# Patient Record
Sex: Male | Born: 1935 | Race: White | Hispanic: No | Smoking: Former smoker
Health system: Southern US, Community
[De-identification: ages and names within clinical notes are randomized; demographics above are authoritative.]

## PROBLEM LIST (undated history)

## (undated) DIAGNOSIS — E119 Type 2 diabetes mellitus without complications: Secondary | ICD-10-CM

## (undated) DIAGNOSIS — J449 Chronic obstructive pulmonary disease, unspecified: Secondary | ICD-10-CM

---

## 2015-12-16 DIAGNOSIS — G4733 Obstructive sleep apnea (adult) (pediatric): Secondary | ICD-10-CM | POA: Diagnosis not present

## 2015-12-18 DIAGNOSIS — E038 Other specified hypothyroidism: Secondary | ICD-10-CM | POA: Diagnosis not present

## 2015-12-18 DIAGNOSIS — E559 Vitamin D deficiency, unspecified: Secondary | ICD-10-CM | POA: Diagnosis not present

## 2015-12-18 DIAGNOSIS — M25551 Pain in right hip: Secondary | ICD-10-CM | POA: Diagnosis not present

## 2015-12-18 DIAGNOSIS — R739 Hyperglycemia, unspecified: Secondary | ICD-10-CM | POA: Diagnosis not present

## 2015-12-18 DIAGNOSIS — F988 Other specified behavioral and emotional disorders with onset usually occurring in childhood and adolescence: Secondary | ICD-10-CM | POA: Diagnosis not present

## 2015-12-18 DIAGNOSIS — K219 Gastro-esophageal reflux disease without esophagitis: Secondary | ICD-10-CM | POA: Diagnosis not present

## 2015-12-18 DIAGNOSIS — C9210 Chronic myeloid leukemia, BCR/ABL-positive, not having achieved remission: Secondary | ICD-10-CM | POA: Diagnosis not present

## 2015-12-18 DIAGNOSIS — B349 Viral infection, unspecified: Secondary | ICD-10-CM | POA: Diagnosis not present

## 2015-12-19 DIAGNOSIS — J449 Chronic obstructive pulmonary disease, unspecified: Secondary | ICD-10-CM | POA: Diagnosis not present

## 2016-01-11 DIAGNOSIS — R0902 Hypoxemia: Secondary | ICD-10-CM | POA: Diagnosis not present

## 2016-01-11 DIAGNOSIS — G911 Obstructive hydrocephalus: Secondary | ICD-10-CM | POA: Diagnosis not present

## 2016-01-11 DIAGNOSIS — J449 Chronic obstructive pulmonary disease, unspecified: Secondary | ICD-10-CM | POA: Diagnosis not present

## 2016-01-12 DIAGNOSIS — R0902 Hypoxemia: Secondary | ICD-10-CM | POA: Diagnosis not present

## 2016-01-12 DIAGNOSIS — J449 Chronic obstructive pulmonary disease, unspecified: Secondary | ICD-10-CM | POA: Diagnosis not present

## 2016-01-12 DIAGNOSIS — G911 Obstructive hydrocephalus: Secondary | ICD-10-CM | POA: Diagnosis not present

## 2016-01-14 DIAGNOSIS — J690 Pneumonitis due to inhalation of food and vomit: Secondary | ICD-10-CM | POA: Diagnosis not present

## 2016-01-16 DIAGNOSIS — G4733 Obstructive sleep apnea (adult) (pediatric): Secondary | ICD-10-CM | POA: Diagnosis not present

## 2016-01-19 DIAGNOSIS — J449 Chronic obstructive pulmonary disease, unspecified: Secondary | ICD-10-CM | POA: Diagnosis not present

## 2016-02-12 DIAGNOSIS — J690 Pneumonitis due to inhalation of food and vomit: Secondary | ICD-10-CM | POA: Diagnosis not present

## 2016-02-12 DIAGNOSIS — J95821 Acute postprocedural respiratory failure: Secondary | ICD-10-CM | POA: Diagnosis not present

## 2016-02-13 DIAGNOSIS — G4733 Obstructive sleep apnea (adult) (pediatric): Secondary | ICD-10-CM | POA: Diagnosis not present

## 2016-02-16 DIAGNOSIS — J449 Chronic obstructive pulmonary disease, unspecified: Secondary | ICD-10-CM | POA: Diagnosis not present

## 2016-03-11 DIAGNOSIS — J690 Pneumonitis due to inhalation of food and vomit: Secondary | ICD-10-CM | POA: Diagnosis not present

## 2016-03-13 DIAGNOSIS — J95821 Acute postprocedural respiratory failure: Secondary | ICD-10-CM | POA: Diagnosis not present

## 2016-03-15 DIAGNOSIS — G4733 Obstructive sleep apnea (adult) (pediatric): Secondary | ICD-10-CM | POA: Diagnosis not present

## 2016-03-17 DIAGNOSIS — Z0000 Encounter for general adult medical examination without abnormal findings: Secondary | ICD-10-CM | POA: Diagnosis not present

## 2016-03-17 DIAGNOSIS — R739 Hyperglycemia, unspecified: Secondary | ICD-10-CM | POA: Diagnosis not present

## 2016-03-17 DIAGNOSIS — E119 Type 2 diabetes mellitus without complications: Secondary | ICD-10-CM | POA: Diagnosis not present

## 2016-03-17 DIAGNOSIS — Z1389 Encounter for screening for other disorder: Secondary | ICD-10-CM | POA: Diagnosis not present

## 2016-03-17 DIAGNOSIS — N401 Enlarged prostate with lower urinary tract symptoms: Secondary | ICD-10-CM | POA: Diagnosis not present

## 2016-03-17 DIAGNOSIS — Z9181 History of falling: Secondary | ICD-10-CM | POA: Diagnosis not present

## 2016-03-17 DIAGNOSIS — M109 Gout, unspecified: Secondary | ICD-10-CM | POA: Diagnosis not present

## 2016-03-17 DIAGNOSIS — J449 Chronic obstructive pulmonary disease, unspecified: Secondary | ICD-10-CM | POA: Diagnosis not present

## 2016-03-18 DIAGNOSIS — J449 Chronic obstructive pulmonary disease, unspecified: Secondary | ICD-10-CM | POA: Diagnosis not present

## 2016-03-28 DIAGNOSIS — L0390 Cellulitis, unspecified: Secondary | ICD-10-CM | POA: Diagnosis not present

## 2016-03-28 DIAGNOSIS — A932 Colorado tick fever: Secondary | ICD-10-CM | POA: Diagnosis not present

## 2016-04-02 DIAGNOSIS — J690 Pneumonitis due to inhalation of food and vomit: Secondary | ICD-10-CM | POA: Diagnosis not present

## 2016-04-02 DIAGNOSIS — J449 Chronic obstructive pulmonary disease, unspecified: Secondary | ICD-10-CM | POA: Diagnosis not present

## 2016-04-11 DIAGNOSIS — J690 Pneumonitis due to inhalation of food and vomit: Secondary | ICD-10-CM | POA: Diagnosis not present

## 2016-04-13 DIAGNOSIS — J95821 Acute postprocedural respiratory failure: Secondary | ICD-10-CM | POA: Diagnosis not present

## 2016-04-14 DIAGNOSIS — E119 Type 2 diabetes mellitus without complications: Secondary | ICD-10-CM | POA: Diagnosis not present

## 2016-04-14 DIAGNOSIS — G4733 Obstructive sleep apnea (adult) (pediatric): Secondary | ICD-10-CM | POA: Diagnosis not present

## 2016-04-14 DIAGNOSIS — Z9981 Dependence on supplemental oxygen: Secondary | ICD-10-CM | POA: Diagnosis not present

## 2016-04-14 DIAGNOSIS — J449 Chronic obstructive pulmonary disease, unspecified: Secondary | ICD-10-CM | POA: Diagnosis not present

## 2016-04-14 DIAGNOSIS — Z0000 Encounter for general adult medical examination without abnormal findings: Secondary | ICD-10-CM | POA: Diagnosis not present

## 2016-04-14 DIAGNOSIS — Z6825 Body mass index (BMI) 25.0-25.9, adult: Secondary | ICD-10-CM | POA: Diagnosis not present

## 2016-04-17 DIAGNOSIS — J449 Chronic obstructive pulmonary disease, unspecified: Secondary | ICD-10-CM | POA: Diagnosis not present

## 2016-05-11 DIAGNOSIS — J690 Pneumonitis due to inhalation of food and vomit: Secondary | ICD-10-CM | POA: Diagnosis not present

## 2016-05-13 DIAGNOSIS — J690 Pneumonitis due to inhalation of food and vomit: Secondary | ICD-10-CM | POA: Diagnosis not present

## 2016-05-13 DIAGNOSIS — J95821 Acute postprocedural respiratory failure: Secondary | ICD-10-CM | POA: Diagnosis not present

## 2016-05-13 DIAGNOSIS — G4733 Obstructive sleep apnea (adult) (pediatric): Secondary | ICD-10-CM | POA: Diagnosis not present

## 2016-05-13 DIAGNOSIS — G911 Obstructive hydrocephalus: Secondary | ICD-10-CM | POA: Diagnosis not present

## 2016-05-15 DIAGNOSIS — G4733 Obstructive sleep apnea (adult) (pediatric): Secondary | ICD-10-CM | POA: Diagnosis not present

## 2016-05-18 DIAGNOSIS — J449 Chronic obstructive pulmonary disease, unspecified: Secondary | ICD-10-CM | POA: Diagnosis not present

## 2016-06-11 DIAGNOSIS — J690 Pneumonitis due to inhalation of food and vomit: Secondary | ICD-10-CM | POA: Diagnosis not present

## 2016-06-13 DIAGNOSIS — J95821 Acute postprocedural respiratory failure: Secondary | ICD-10-CM | POA: Diagnosis not present

## 2016-06-13 DIAGNOSIS — J690 Pneumonitis due to inhalation of food and vomit: Secondary | ICD-10-CM | POA: Diagnosis not present

## 2016-06-14 DIAGNOSIS — J690 Pneumonitis due to inhalation of food and vomit: Secondary | ICD-10-CM | POA: Diagnosis not present

## 2016-06-17 DIAGNOSIS — J449 Chronic obstructive pulmonary disease, unspecified: Secondary | ICD-10-CM | POA: Diagnosis not present

## 2016-06-25 DIAGNOSIS — N401 Enlarged prostate with lower urinary tract symptoms: Secondary | ICD-10-CM | POA: Diagnosis not present

## 2016-06-25 DIAGNOSIS — J449 Chronic obstructive pulmonary disease, unspecified: Secondary | ICD-10-CM | POA: Diagnosis not present

## 2016-06-25 DIAGNOSIS — M109 Gout, unspecified: Secondary | ICD-10-CM | POA: Diagnosis not present

## 2016-06-25 DIAGNOSIS — R739 Hyperglycemia, unspecified: Secondary | ICD-10-CM | POA: Diagnosis not present

## 2016-07-11 DIAGNOSIS — J690 Pneumonitis due to inhalation of food and vomit: Secondary | ICD-10-CM | POA: Diagnosis not present

## 2016-07-13 DIAGNOSIS — G911 Obstructive hydrocephalus: Secondary | ICD-10-CM | POA: Diagnosis not present

## 2016-07-13 DIAGNOSIS — G4733 Obstructive sleep apnea (adult) (pediatric): Secondary | ICD-10-CM | POA: Diagnosis not present

## 2016-07-13 DIAGNOSIS — J690 Pneumonitis due to inhalation of food and vomit: Secondary | ICD-10-CM | POA: Diagnosis not present

## 2016-07-15 DIAGNOSIS — J690 Pneumonitis due to inhalation of food and vomit: Secondary | ICD-10-CM | POA: Diagnosis not present

## 2016-07-15 DIAGNOSIS — G4733 Obstructive sleep apnea (adult) (pediatric): Secondary | ICD-10-CM | POA: Diagnosis not present

## 2016-07-15 DIAGNOSIS — G911 Obstructive hydrocephalus: Secondary | ICD-10-CM | POA: Diagnosis not present

## 2016-07-18 DIAGNOSIS — J449 Chronic obstructive pulmonary disease, unspecified: Secondary | ICD-10-CM | POA: Diagnosis not present

## 2016-07-21 DIAGNOSIS — E119 Type 2 diabetes mellitus without complications: Secondary | ICD-10-CM | POA: Diagnosis not present

## 2016-08-11 DIAGNOSIS — G4733 Obstructive sleep apnea (adult) (pediatric): Secondary | ICD-10-CM | POA: Diagnosis not present

## 2016-08-13 DIAGNOSIS — G911 Obstructive hydrocephalus: Secondary | ICD-10-CM | POA: Diagnosis not present

## 2016-08-13 DIAGNOSIS — J690 Pneumonitis due to inhalation of food and vomit: Secondary | ICD-10-CM | POA: Diagnosis not present

## 2016-08-13 DIAGNOSIS — G4733 Obstructive sleep apnea (adult) (pediatric): Secondary | ICD-10-CM | POA: Diagnosis not present

## 2016-08-13 DIAGNOSIS — J95821 Acute postprocedural respiratory failure: Secondary | ICD-10-CM | POA: Diagnosis not present

## 2016-08-15 DIAGNOSIS — G4733 Obstructive sleep apnea (adult) (pediatric): Secondary | ICD-10-CM | POA: Diagnosis not present

## 2016-08-18 DIAGNOSIS — J449 Chronic obstructive pulmonary disease, unspecified: Secondary | ICD-10-CM | POA: Diagnosis not present

## 2016-09-11 DIAGNOSIS — J690 Pneumonitis due to inhalation of food and vomit: Secondary | ICD-10-CM | POA: Diagnosis not present

## 2016-09-13 DIAGNOSIS — J95821 Acute postprocedural respiratory failure: Secondary | ICD-10-CM | POA: Diagnosis not present

## 2016-09-13 DIAGNOSIS — J690 Pneumonitis due to inhalation of food and vomit: Secondary | ICD-10-CM | POA: Diagnosis not present

## 2016-09-14 DIAGNOSIS — G4733 Obstructive sleep apnea (adult) (pediatric): Secondary | ICD-10-CM | POA: Diagnosis not present

## 2016-09-17 DIAGNOSIS — J449 Chronic obstructive pulmonary disease, unspecified: Secondary | ICD-10-CM | POA: Diagnosis not present

## 2016-10-01 DIAGNOSIS — N401 Enlarged prostate with lower urinary tract symptoms: Secondary | ICD-10-CM | POA: Diagnosis not present

## 2016-10-01 DIAGNOSIS — R739 Hyperglycemia, unspecified: Secondary | ICD-10-CM | POA: Diagnosis not present

## 2016-10-01 DIAGNOSIS — Z6825 Body mass index (BMI) 25.0-25.9, adult: Secondary | ICD-10-CM | POA: Diagnosis not present

## 2016-10-01 DIAGNOSIS — E663 Overweight: Secondary | ICD-10-CM | POA: Diagnosis not present

## 2016-10-01 DIAGNOSIS — M109 Gout, unspecified: Secondary | ICD-10-CM | POA: Diagnosis not present

## 2016-10-01 DIAGNOSIS — J449 Chronic obstructive pulmonary disease, unspecified: Secondary | ICD-10-CM | POA: Diagnosis not present

## 2016-10-02 DIAGNOSIS — J95821 Acute postprocedural respiratory failure: Secondary | ICD-10-CM | POA: Diagnosis not present

## 2016-10-02 DIAGNOSIS — J449 Chronic obstructive pulmonary disease, unspecified: Secondary | ICD-10-CM | POA: Diagnosis not present

## 2016-10-02 DIAGNOSIS — J690 Pneumonitis due to inhalation of food and vomit: Secondary | ICD-10-CM | POA: Diagnosis not present

## 2016-10-11 DIAGNOSIS — J690 Pneumonitis due to inhalation of food and vomit: Secondary | ICD-10-CM | POA: Diagnosis not present

## 2016-10-13 DIAGNOSIS — J690 Pneumonitis due to inhalation of food and vomit: Secondary | ICD-10-CM | POA: Diagnosis not present

## 2016-10-15 DIAGNOSIS — G4733 Obstructive sleep apnea (adult) (pediatric): Secondary | ICD-10-CM | POA: Diagnosis not present

## 2016-10-18 DIAGNOSIS — J449 Chronic obstructive pulmonary disease, unspecified: Secondary | ICD-10-CM | POA: Diagnosis not present

## 2016-10-22 DIAGNOSIS — J449 Chronic obstructive pulmonary disease, unspecified: Secondary | ICD-10-CM | POA: Diagnosis not present

## 2016-10-22 DIAGNOSIS — J690 Pneumonitis due to inhalation of food and vomit: Secondary | ICD-10-CM | POA: Diagnosis not present

## 2016-11-11 DIAGNOSIS — J690 Pneumonitis due to inhalation of food and vomit: Secondary | ICD-10-CM | POA: Diagnosis not present

## 2016-11-13 DIAGNOSIS — J690 Pneumonitis due to inhalation of food and vomit: Secondary | ICD-10-CM | POA: Diagnosis not present

## 2016-11-13 DIAGNOSIS — J95821 Acute postprocedural respiratory failure: Secondary | ICD-10-CM | POA: Diagnosis not present

## 2016-11-14 DIAGNOSIS — J95821 Acute postprocedural respiratory failure: Secondary | ICD-10-CM | POA: Diagnosis not present

## 2016-12-11 DIAGNOSIS — J690 Pneumonitis due to inhalation of food and vomit: Secondary | ICD-10-CM | POA: Diagnosis not present

## 2016-12-13 DIAGNOSIS — J690 Pneumonitis due to inhalation of food and vomit: Secondary | ICD-10-CM | POA: Diagnosis not present

## 2016-12-15 DIAGNOSIS — G4733 Obstructive sleep apnea (adult) (pediatric): Secondary | ICD-10-CM | POA: Diagnosis not present

## 2017-01-06 DIAGNOSIS — N401 Enlarged prostate with lower urinary tract symptoms: Secondary | ICD-10-CM | POA: Diagnosis not present

## 2017-01-06 DIAGNOSIS — R739 Hyperglycemia, unspecified: Secondary | ICD-10-CM | POA: Diagnosis not present

## 2017-01-06 DIAGNOSIS — M109 Gout, unspecified: Secondary | ICD-10-CM | POA: Diagnosis not present

## 2017-01-06 DIAGNOSIS — Z6825 Body mass index (BMI) 25.0-25.9, adult: Secondary | ICD-10-CM | POA: Diagnosis not present

## 2017-01-06 DIAGNOSIS — E663 Overweight: Secondary | ICD-10-CM | POA: Diagnosis not present

## 2017-01-06 DIAGNOSIS — J449 Chronic obstructive pulmonary disease, unspecified: Secondary | ICD-10-CM | POA: Diagnosis not present

## 2017-01-06 DIAGNOSIS — D649 Anemia, unspecified: Secondary | ICD-10-CM | POA: Diagnosis not present

## 2017-01-06 DIAGNOSIS — Z791 Long term (current) use of non-steroidal anti-inflammatories (NSAID): Secondary | ICD-10-CM | POA: Diagnosis not present

## 2017-01-06 DIAGNOSIS — H6121 Impacted cerumen, right ear: Secondary | ICD-10-CM | POA: Diagnosis not present

## 2017-01-11 DIAGNOSIS — J690 Pneumonitis due to inhalation of food and vomit: Secondary | ICD-10-CM | POA: Diagnosis not present

## 2017-01-13 DIAGNOSIS — G4733 Obstructive sleep apnea (adult) (pediatric): Secondary | ICD-10-CM | POA: Diagnosis not present

## 2017-01-15 DIAGNOSIS — G4733 Obstructive sleep apnea (adult) (pediatric): Secondary | ICD-10-CM | POA: Diagnosis not present

## 2017-02-11 DIAGNOSIS — G4733 Obstructive sleep apnea (adult) (pediatric): Secondary | ICD-10-CM | POA: Diagnosis not present

## 2017-02-12 DIAGNOSIS — G4733 Obstructive sleep apnea (adult) (pediatric): Secondary | ICD-10-CM | POA: Diagnosis not present

## 2017-02-19 ENCOUNTER — Emergency Department (HOSPITAL_COMMUNITY): Payer: PPO

## 2017-02-19 ENCOUNTER — Encounter (HOSPITAL_COMMUNITY): Payer: Self-pay

## 2017-02-19 ENCOUNTER — Emergency Department (HOSPITAL_COMMUNITY)
Admission: EM | Admit: 2017-02-19 | Discharge: 2017-02-19 | Disposition: A | Discharge status: Home / Self Care | Payer: PPO | Attending: Emergency Medicine | Admitting: Emergency Medicine

## 2017-02-19 DIAGNOSIS — Z87891 Personal history of nicotine dependence: Secondary | ICD-10-CM | POA: Diagnosis not present

## 2017-02-19 DIAGNOSIS — S4992XA Unspecified injury of left shoulder and upper arm, initial encounter: Secondary | ICD-10-CM | POA: Diagnosis not present

## 2017-02-19 DIAGNOSIS — Y999 Unspecified external cause status: Secondary | ICD-10-CM | POA: Insufficient documentation

## 2017-02-19 DIAGNOSIS — Y9389 Activity, other specified: Secondary | ICD-10-CM | POA: Insufficient documentation

## 2017-02-19 DIAGNOSIS — S41109A Unspecified open wound of unspecified upper arm, initial encounter: Secondary | ICD-10-CM | POA: Diagnosis not present

## 2017-02-19 DIAGNOSIS — J449 Chronic obstructive pulmonary disease, unspecified: Secondary | ICD-10-CM | POA: Diagnosis not present

## 2017-02-19 DIAGNOSIS — W3409XA Accidental discharge from other specified firearms, initial encounter: Secondary | ICD-10-CM | POA: Insufficient documentation

## 2017-02-19 DIAGNOSIS — S51832A Puncture wound without foreign body of left forearm, initial encounter: Secondary | ICD-10-CM | POA: Insufficient documentation

## 2017-02-19 DIAGNOSIS — T148XXA Other injury of unspecified body region, initial encounter: Secondary | ICD-10-CM | POA: Diagnosis not present

## 2017-02-19 DIAGNOSIS — W3400XA Accidental discharge from unspecified firearms or gun, initial encounter: Secondary | ICD-10-CM

## 2017-02-19 DIAGNOSIS — E119 Type 2 diabetes mellitus without complications: Secondary | ICD-10-CM | POA: Diagnosis not present

## 2017-02-19 DIAGNOSIS — S51802A Unspecified open wound of left forearm, initial encounter: Secondary | ICD-10-CM | POA: Diagnosis not present

## 2017-02-19 DIAGNOSIS — M2652 Limited mandibular range of motion: Secondary | ICD-10-CM | POA: Diagnosis not present

## 2017-02-19 DIAGNOSIS — Y92009 Unspecified place in unspecified non-institutional (private) residence as the place of occurrence of the external cause: Secondary | ICD-10-CM | POA: Insufficient documentation

## 2017-02-19 HISTORY — DX: Chronic obstructive pulmonary disease, unspecified: J44.9

## 2017-02-19 HISTORY — DX: Type 2 diabetes mellitus without complications: E11.9

## 2017-02-19 MED ORDER — CEPHALEXIN 500 MG PO CAPS: 500.0000 mg | ORAL_CAPSULE | Freq: Three times a day (TID) | ORAL | 0 refills | Status: AC
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
## 2017-02-26 DIAGNOSIS — W3400XA Accidental discharge from unspecified firearms or gun, initial encounter: Secondary | ICD-10-CM | POA: Diagnosis not present

## 2017-02-26 DIAGNOSIS — S41102A Unspecified open wound of left upper arm, initial encounter: Secondary | ICD-10-CM | POA: Diagnosis not present

## 2017-03-11 DIAGNOSIS — J690 Pneumonitis due to inhalation of food and vomit: Secondary | ICD-10-CM | POA: Diagnosis not present

## 2017-03-15 DIAGNOSIS — G4733 Obstructive sleep apnea (adult) (pediatric): Secondary | ICD-10-CM | POA: Diagnosis not present

## 2017-04-07 DIAGNOSIS — Z79899 Other long term (current) drug therapy: Secondary | ICD-10-CM | POA: Diagnosis not present

## 2017-04-07 DIAGNOSIS — M109 Gout, unspecified: Secondary | ICD-10-CM | POA: Diagnosis not present

## 2017-04-07 DIAGNOSIS — R739 Hyperglycemia, unspecified: Secondary | ICD-10-CM | POA: Diagnosis not present

## 2017-04-07 DIAGNOSIS — Z1389 Encounter for screening for other disorder: Secondary | ICD-10-CM | POA: Diagnosis not present

## 2017-04-07 DIAGNOSIS — Z9181 History of falling: Secondary | ICD-10-CM | POA: Diagnosis not present

## 2017-04-07 DIAGNOSIS — J449 Chronic obstructive pulmonary disease, unspecified: Secondary | ICD-10-CM | POA: Diagnosis not present

## 2017-04-07 DIAGNOSIS — M818 Other osteoporosis without current pathological fracture: Secondary | ICD-10-CM | POA: Diagnosis not present

## 2017-04-07 DIAGNOSIS — R002 Palpitations: Secondary | ICD-10-CM | POA: Diagnosis not present

## 2017-04-07 DIAGNOSIS — R000 Tachycardia, unspecified: Secondary | ICD-10-CM | POA: Diagnosis not present

## 2017-04-07 DIAGNOSIS — N401 Enlarged prostate with lower urinary tract symptoms: Secondary | ICD-10-CM | POA: Diagnosis not present

## 2017-04-11 DIAGNOSIS — J690 Pneumonitis due to inhalation of food and vomit: Secondary | ICD-10-CM | POA: Diagnosis not present

## 2017-04-14 DIAGNOSIS — G4733 Obstructive sleep apnea (adult) (pediatric): Secondary | ICD-10-CM | POA: Diagnosis not present

## 2017-04-29 DIAGNOSIS — L578 Other skin changes due to chronic exposure to nonionizing radiation: Secondary | ICD-10-CM | POA: Diagnosis not present

## 2017-04-29 DIAGNOSIS — L570 Actinic keratosis: Secondary | ICD-10-CM | POA: Diagnosis not present

## 2017-05-11 DIAGNOSIS — J690 Pneumonitis due to inhalation of food and vomit: Secondary | ICD-10-CM | POA: Diagnosis not present

## 2017-05-15 DIAGNOSIS — J690 Pneumonitis due to inhalation of food and vomit: Secondary | ICD-10-CM | POA: Diagnosis not present

## 2017-06-11 DIAGNOSIS — J690 Pneumonitis due to inhalation of food and vomit: Secondary | ICD-10-CM | POA: Diagnosis not present

## 2017-06-14 DIAGNOSIS — G4733 Obstructive sleep apnea (adult) (pediatric): Secondary | ICD-10-CM | POA: Diagnosis not present

## 2017-07-07 DIAGNOSIS — Z1389 Encounter for screening for other disorder: Secondary | ICD-10-CM | POA: Diagnosis not present

## 2017-07-07 DIAGNOSIS — M109 Gout, unspecified: Secondary | ICD-10-CM | POA: Diagnosis not present

## 2017-07-07 DIAGNOSIS — N401 Enlarged prostate with lower urinary tract symptoms: Secondary | ICD-10-CM | POA: Diagnosis not present

## 2017-07-07 DIAGNOSIS — M818 Other osteoporosis without current pathological fracture: Secondary | ICD-10-CM | POA: Diagnosis not present

## 2017-07-07 DIAGNOSIS — J449 Chronic obstructive pulmonary disease, unspecified: Secondary | ICD-10-CM | POA: Diagnosis not present

## 2017-07-07 DIAGNOSIS — R739 Hyperglycemia, unspecified: Secondary | ICD-10-CM | POA: Diagnosis not present

## 2017-07-07 DIAGNOSIS — Z6824 Body mass index (BMI) 24.0-24.9, adult: Secondary | ICD-10-CM | POA: Diagnosis not present

## 2017-07-11 DIAGNOSIS — J690 Pneumonitis due to inhalation of food and vomit: Secondary | ICD-10-CM | POA: Diagnosis not present

## 2017-08-11 DIAGNOSIS — J690 Pneumonitis due to inhalation of food and vomit: Secondary | ICD-10-CM | POA: Diagnosis not present

## 2017-08-11 DIAGNOSIS — J9601 Acute respiratory failure with hypoxia: Secondary | ICD-10-CM | POA: Diagnosis not present

## 2017-08-28 DIAGNOSIS — J189 Pneumonia, unspecified organism: Secondary | ICD-10-CM | POA: Diagnosis not present

## 2017-08-28 DIAGNOSIS — Z6824 Body mass index (BMI) 24.0-24.9, adult: Secondary | ICD-10-CM | POA: Diagnosis not present

## 2017-08-28 DIAGNOSIS — J449 Chronic obstructive pulmonary disease, unspecified: Secondary | ICD-10-CM | POA: Diagnosis not present

## 2017-09-11 DIAGNOSIS — J690 Pneumonitis due to inhalation of food and vomit: Secondary | ICD-10-CM | POA: Diagnosis not present

## 2017-10-07 DIAGNOSIS — M109 Gout, unspecified: Secondary | ICD-10-CM | POA: Diagnosis not present

## 2017-10-07 DIAGNOSIS — J449 Chronic obstructive pulmonary disease, unspecified: Secondary | ICD-10-CM | POA: Diagnosis not present

## 2017-10-07 DIAGNOSIS — B079 Viral wart, unspecified: Secondary | ICD-10-CM | POA: Diagnosis not present

## 2017-10-07 DIAGNOSIS — N401 Enlarged prostate with lower urinary tract symptoms: Secondary | ICD-10-CM | POA: Diagnosis not present

## 2017-10-07 DIAGNOSIS — Z6824 Body mass index (BMI) 24.0-24.9, adult: Secondary | ICD-10-CM | POA: Diagnosis not present

## 2017-10-07 DIAGNOSIS — M818 Other osteoporosis without current pathological fracture: Secondary | ICD-10-CM | POA: Diagnosis not present

## 2017-10-07 DIAGNOSIS — R739 Hyperglycemia, unspecified: Secondary | ICD-10-CM | POA: Diagnosis not present

## 2017-10-11 DIAGNOSIS — J690 Pneumonitis due to inhalation of food and vomit: Secondary | ICD-10-CM | POA: Diagnosis not present

## 2017-10-15 DIAGNOSIS — G4733 Obstructive sleep apnea (adult) (pediatric): Secondary | ICD-10-CM | POA: Diagnosis not present

## 2017-11-11 DIAGNOSIS — J690 Pneumonitis due to inhalation of food and vomit: Secondary | ICD-10-CM | POA: Diagnosis not present

## 2017-11-14 DIAGNOSIS — G4733 Obstructive sleep apnea (adult) (pediatric): Secondary | ICD-10-CM | POA: Diagnosis not present

## 2017-12-11 DIAGNOSIS — G4733 Obstructive sleep apnea (adult) (pediatric): Secondary | ICD-10-CM | POA: Diagnosis not present

## 2017-12-11 DIAGNOSIS — J690 Pneumonitis due to inhalation of food and vomit: Secondary | ICD-10-CM | POA: Diagnosis not present

## 2017-12-11 DIAGNOSIS — G911 Obstructive hydrocephalus: Secondary | ICD-10-CM | POA: Diagnosis not present

## 2017-12-16 DIAGNOSIS — N401 Enlarged prostate with lower urinary tract symptoms: Secondary | ICD-10-CM | POA: Diagnosis not present

## 2017-12-16 DIAGNOSIS — R739 Hyperglycemia, unspecified: Secondary | ICD-10-CM | POA: Diagnosis not present

## 2017-12-16 DIAGNOSIS — J449 Chronic obstructive pulmonary disease, unspecified: Secondary | ICD-10-CM | POA: Diagnosis not present

## 2017-12-16 DIAGNOSIS — L570 Actinic keratosis: Secondary | ICD-10-CM | POA: Diagnosis not present

## 2017-12-16 DIAGNOSIS — M109 Gout, unspecified: Secondary | ICD-10-CM | POA: Diagnosis not present

## 2017-12-16 DIAGNOSIS — J189 Pneumonia, unspecified organism: Secondary | ICD-10-CM | POA: Diagnosis not present

## 2017-12-16 DIAGNOSIS — Z6824 Body mass index (BMI) 24.0-24.9, adult: Secondary | ICD-10-CM | POA: Diagnosis not present

## 2018-01-07 DIAGNOSIS — J449 Chronic obstructive pulmonary disease, unspecified: Secondary | ICD-10-CM | POA: Diagnosis not present

## 2018-01-07 DIAGNOSIS — R739 Hyperglycemia, unspecified: Secondary | ICD-10-CM | POA: Diagnosis not present

## 2018-01-07 DIAGNOSIS — L570 Actinic keratosis: Secondary | ICD-10-CM | POA: Diagnosis not present

## 2018-01-07 DIAGNOSIS — M109 Gout, unspecified: Secondary | ICD-10-CM | POA: Diagnosis not present

## 2018-01-07 DIAGNOSIS — N401 Enlarged prostate with lower urinary tract symptoms: Secondary | ICD-10-CM | POA: Diagnosis not present

## 2018-01-07 DIAGNOSIS — Z79899 Other long term (current) drug therapy: Secondary | ICD-10-CM | POA: Diagnosis not present

## 2018-01-07 DIAGNOSIS — Z6824 Body mass index (BMI) 24.0-24.9, adult: Secondary | ICD-10-CM | POA: Diagnosis not present

## 2018-01-07 DIAGNOSIS — M818 Other osteoporosis without current pathological fracture: Secondary | ICD-10-CM | POA: Diagnosis not present

## 2018-01-11 DIAGNOSIS — J690 Pneumonitis due to inhalation of food and vomit: Secondary | ICD-10-CM | POA: Diagnosis not present

## 2018-01-11 DIAGNOSIS — J9601 Acute respiratory failure with hypoxia: Secondary | ICD-10-CM | POA: Diagnosis not present

## 2018-01-15 DIAGNOSIS — G4733 Obstructive sleep apnea (adult) (pediatric): Secondary | ICD-10-CM | POA: Diagnosis not present

## 2018-02-11 DIAGNOSIS — J9601 Acute respiratory failure with hypoxia: Secondary | ICD-10-CM | POA: Diagnosis not present

## 2018-02-11 DIAGNOSIS — G911 Obstructive hydrocephalus: Secondary | ICD-10-CM | POA: Diagnosis not present

## 2018-02-11 DIAGNOSIS — G4733 Obstructive sleep apnea (adult) (pediatric): Secondary | ICD-10-CM | POA: Diagnosis not present

## 2018-02-11 DIAGNOSIS — J690 Pneumonitis due to inhalation of food and vomit: Secondary | ICD-10-CM | POA: Diagnosis not present

## 2018-02-12 DIAGNOSIS — G4733 Obstructive sleep apnea (adult) (pediatric): Secondary | ICD-10-CM | POA: Diagnosis not present

## 2018-02-23 DIAGNOSIS — N401 Enlarged prostate with lower urinary tract symptoms: Secondary | ICD-10-CM | POA: Diagnosis not present

## 2018-02-23 DIAGNOSIS — J189 Pneumonia, unspecified organism: Secondary | ICD-10-CM | POA: Diagnosis not present

## 2018-02-23 DIAGNOSIS — Z6825 Body mass index (BMI) 25.0-25.9, adult: Secondary | ICD-10-CM | POA: Diagnosis not present

## 2018-02-23 DIAGNOSIS — R739 Hyperglycemia, unspecified: Secondary | ICD-10-CM | POA: Diagnosis not present

## 2018-02-23 DIAGNOSIS — R6889 Other general symptoms and signs: Secondary | ICD-10-CM | POA: Diagnosis not present

## 2018-02-23 DIAGNOSIS — J449 Chronic obstructive pulmonary disease, unspecified: Secondary | ICD-10-CM | POA: Diagnosis not present

## 2018-02-23 DIAGNOSIS — M109 Gout, unspecified: Secondary | ICD-10-CM | POA: Diagnosis not present

## 2018-02-23 DIAGNOSIS — R002 Palpitations: Secondary | ICD-10-CM | POA: Diagnosis not present

## 2018-04-09 DIAGNOSIS — R739 Hyperglycemia, unspecified: Secondary | ICD-10-CM | POA: Diagnosis not present

## 2018-04-09 DIAGNOSIS — M109 Gout, unspecified: Secondary | ICD-10-CM | POA: Diagnosis not present

## 2018-04-09 DIAGNOSIS — Z9181 History of falling: Secondary | ICD-10-CM | POA: Diagnosis not present

## 2018-04-09 DIAGNOSIS — Z6824 Body mass index (BMI) 24.0-24.9, adult: Secondary | ICD-10-CM | POA: Diagnosis not present

## 2018-04-09 DIAGNOSIS — M818 Other osteoporosis without current pathological fracture: Secondary | ICD-10-CM | POA: Diagnosis not present

## 2018-04-09 DIAGNOSIS — Z79899 Other long term (current) drug therapy: Secondary | ICD-10-CM | POA: Diagnosis not present

## 2018-04-09 DIAGNOSIS — J449 Chronic obstructive pulmonary disease, unspecified: Secondary | ICD-10-CM | POA: Diagnosis not present

## 2018-04-09 DIAGNOSIS — N401 Enlarged prostate with lower urinary tract symptoms: Secondary | ICD-10-CM | POA: Diagnosis not present

## 2018-04-09 DIAGNOSIS — D649 Anemia, unspecified: Secondary | ICD-10-CM | POA: Diagnosis not present

## 2018-07-12 DIAGNOSIS — R739 Hyperglycemia, unspecified: Secondary | ICD-10-CM | POA: Diagnosis not present

## 2018-07-12 DIAGNOSIS — M109 Gout, unspecified: Secondary | ICD-10-CM | POA: Diagnosis not present

## 2018-07-12 DIAGNOSIS — Z1339 Encounter for screening examination for other mental health and behavioral disorders: Secondary | ICD-10-CM | POA: Diagnosis not present

## 2018-07-12 DIAGNOSIS — J449 Chronic obstructive pulmonary disease, unspecified: Secondary | ICD-10-CM | POA: Diagnosis not present

## 2018-07-12 DIAGNOSIS — Z1331 Encounter for screening for depression: Secondary | ICD-10-CM | POA: Diagnosis not present

## 2018-07-12 DIAGNOSIS — N401 Enlarged prostate with lower urinary tract symptoms: Secondary | ICD-10-CM | POA: Diagnosis not present

## 2018-07-12 DIAGNOSIS — Z6825 Body mass index (BMI) 25.0-25.9, adult: Secondary | ICD-10-CM | POA: Diagnosis not present

## 2018-07-12 DIAGNOSIS — E559 Vitamin D deficiency, unspecified: Secondary | ICD-10-CM | POA: Diagnosis not present

## 2018-07-12 DIAGNOSIS — J189 Pneumonia, unspecified organism: Secondary | ICD-10-CM | POA: Diagnosis not present
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
## 2018-10-12 DIAGNOSIS — Z23 Encounter for immunization: Secondary | ICD-10-CM | POA: Diagnosis not present

## 2018-10-12 DIAGNOSIS — Z79899 Other long term (current) drug therapy: Secondary | ICD-10-CM | POA: Diagnosis not present

## 2018-10-12 DIAGNOSIS — M109 Gout, unspecified: Secondary | ICD-10-CM | POA: Diagnosis not present

## 2018-10-12 DIAGNOSIS — E559 Vitamin D deficiency, unspecified: Secondary | ICD-10-CM | POA: Diagnosis not present

## 2018-10-12 DIAGNOSIS — Z6825 Body mass index (BMI) 25.0-25.9, adult: Secondary | ICD-10-CM | POA: Diagnosis not present

## 2018-10-12 DIAGNOSIS — L570 Actinic keratosis: Secondary | ICD-10-CM | POA: Diagnosis not present

## 2018-10-12 DIAGNOSIS — J449 Chronic obstructive pulmonary disease, unspecified: Secondary | ICD-10-CM | POA: Diagnosis not present

## 2018-10-12 DIAGNOSIS — E663 Overweight: Secondary | ICD-10-CM | POA: Diagnosis not present

## 2018-10-12 DIAGNOSIS — R739 Hyperglycemia, unspecified: Secondary | ICD-10-CM | POA: Diagnosis not present

## 2018-10-12 DIAGNOSIS — N401 Enlarged prostate with lower urinary tract symptoms: Secondary | ICD-10-CM | POA: Diagnosis not present

## 2019-01-13 DIAGNOSIS — E785 Hyperlipidemia, unspecified: Secondary | ICD-10-CM | POA: Diagnosis not present

## 2019-01-13 DIAGNOSIS — I10 Essential (primary) hypertension: Secondary | ICD-10-CM | POA: Diagnosis not present

## 2019-01-13 DIAGNOSIS — Z79899 Other long term (current) drug therapy: Secondary | ICD-10-CM | POA: Diagnosis not present

## 2019-01-13 DIAGNOSIS — I2510 Atherosclerotic heart disease of native coronary artery without angina pectoris: Secondary | ICD-10-CM | POA: Diagnosis not present

## 2019-01-13 DIAGNOSIS — E118 Type 2 diabetes mellitus with unspecified complications: Secondary | ICD-10-CM | POA: Diagnosis not present

## 2019-01-13 DIAGNOSIS — Z6826 Body mass index (BMI) 26.0-26.9, adult: Secondary | ICD-10-CM | POA: Diagnosis not present

## 2019-01-13 DIAGNOSIS — R739 Hyperglycemia, unspecified: Secondary | ICD-10-CM | POA: Diagnosis not present

## 2019-01-13 DIAGNOSIS — D649 Anemia, unspecified: Secondary | ICD-10-CM | POA: Diagnosis not present

## 2019-04-19 DIAGNOSIS — E559 Vitamin D deficiency, unspecified: Secondary | ICD-10-CM | POA: Diagnosis not present

## 2019-04-19 DIAGNOSIS — R739 Hyperglycemia, unspecified: Secondary | ICD-10-CM | POA: Diagnosis not present

## 2019-04-19 DIAGNOSIS — J449 Chronic obstructive pulmonary disease, unspecified: Secondary | ICD-10-CM | POA: Diagnosis not present

## 2019-04-19 DIAGNOSIS — N401 Enlarged prostate with lower urinary tract symptoms: Secondary | ICD-10-CM | POA: Diagnosis not present

## 2019-04-19 DIAGNOSIS — M109 Gout, unspecified: Secondary | ICD-10-CM | POA: Diagnosis not present

## 2019-07-03 DIAGNOSIS — R509 Fever, unspecified: Secondary | ICD-10-CM | POA: Diagnosis not present

## 2019-07-03 DIAGNOSIS — R05 Cough: Secondary | ICD-10-CM | POA: Diagnosis not present

## 2019-07-03 DIAGNOSIS — J449 Chronic obstructive pulmonary disease, unspecified: Secondary | ICD-10-CM | POA: Diagnosis not present

## 2019-07-28 DIAGNOSIS — Z79899 Other long term (current) drug therapy: Secondary | ICD-10-CM | POA: Diagnosis not present

## 2019-07-28 DIAGNOSIS — M109 Gout, unspecified: Secondary | ICD-10-CM | POA: Diagnosis not present

## 2019-07-28 DIAGNOSIS — E559 Vitamin D deficiency, unspecified: Secondary | ICD-10-CM | POA: Diagnosis not present

## 2019-07-28 DIAGNOSIS — R351 Nocturia: Secondary | ICD-10-CM | POA: Diagnosis not present

## 2019-07-28 DIAGNOSIS — N401 Enlarged prostate with lower urinary tract symptoms: Secondary | ICD-10-CM | POA: Diagnosis not present

## 2019-07-28 DIAGNOSIS — E538 Deficiency of other specified B group vitamins: Secondary | ICD-10-CM | POA: Diagnosis not present

## 2019-07-28 DIAGNOSIS — J449 Chronic obstructive pulmonary disease, unspecified: Secondary | ICD-10-CM | POA: Diagnosis not present

## 2019-07-28 DIAGNOSIS — Z1331 Encounter for screening for depression: Secondary | ICD-10-CM | POA: Diagnosis not present

## 2019-07-28 DIAGNOSIS — R739 Hyperglycemia, unspecified: Secondary | ICD-10-CM | POA: Diagnosis not present

## 2019-07-28 DIAGNOSIS — Z9181 History of falling: Secondary | ICD-10-CM | POA: Diagnosis not present

## 2019-07-28 DIAGNOSIS — R002 Palpitations: Secondary | ICD-10-CM | POA: Diagnosis not present

## 2019-10-31 DIAGNOSIS — J449 Chronic obstructive pulmonary disease, unspecified: Secondary | ICD-10-CM | POA: Diagnosis not present

## 2019-10-31 DIAGNOSIS — J069 Acute upper respiratory infection, unspecified: Secondary | ICD-10-CM | POA: Diagnosis not present

## 2019-11-21 DIAGNOSIS — J189 Pneumonia, unspecified organism: Secondary | ICD-10-CM | POA: Diagnosis not present

## 2019-11-21 DIAGNOSIS — J449 Chronic obstructive pulmonary disease, unspecified: Secondary | ICD-10-CM | POA: Diagnosis not present

## 2019-11-21 DIAGNOSIS — B349 Viral infection, unspecified: Secondary | ICD-10-CM | POA: Diagnosis not present

## 2020-01-31 DIAGNOSIS — E559 Vitamin D deficiency, unspecified: Secondary | ICD-10-CM | POA: Diagnosis not present

## 2020-01-31 DIAGNOSIS — N401 Enlarged prostate with lower urinary tract symptoms: Secondary | ICD-10-CM | POA: Diagnosis not present

## 2020-01-31 DIAGNOSIS — M109 Gout, unspecified: Secondary | ICD-10-CM | POA: Diagnosis not present

## 2020-01-31 DIAGNOSIS — J069 Acute upper respiratory infection, unspecified: Secondary | ICD-10-CM | POA: Diagnosis not present

## 2020-01-31 DIAGNOSIS — R739 Hyperglycemia, unspecified: Secondary | ICD-10-CM | POA: Diagnosis not present

## 2020-01-31 DIAGNOSIS — J449 Chronic obstructive pulmonary disease, unspecified: Secondary | ICD-10-CM | POA: Diagnosis not present

## 2020-01-31 DIAGNOSIS — R351 Nocturia: Secondary | ICD-10-CM | POA: Diagnosis not present

## 2020-04-30 DIAGNOSIS — R351 Nocturia: Secondary | ICD-10-CM | POA: Diagnosis not present

## 2020-04-30 DIAGNOSIS — J449 Chronic obstructive pulmonary disease, unspecified: Secondary | ICD-10-CM | POA: Diagnosis not present

## 2020-04-30 DIAGNOSIS — R739 Hyperglycemia, unspecified: Secondary | ICD-10-CM | POA: Diagnosis not present

## 2020-04-30 DIAGNOSIS — N401 Enlarged prostate with lower urinary tract symptoms: Secondary | ICD-10-CM | POA: Diagnosis not present

## 2020-04-30 DIAGNOSIS — M109 Gout, unspecified: Secondary | ICD-10-CM | POA: Diagnosis not present

## 2020-04-30 DIAGNOSIS — E559 Vitamin D deficiency, unspecified: Secondary | ICD-10-CM | POA: Diagnosis not present

## 2020-05-27 DIAGNOSIS — S239XXA Sprain of unspecified parts of thorax, initial encounter: Secondary | ICD-10-CM | POA: Diagnosis not present

## 2020-08-07 DIAGNOSIS — J069 Acute upper respiratory infection, unspecified: Secondary | ICD-10-CM | POA: Diagnosis not present

## 2020-08-07 DIAGNOSIS — R351 Nocturia: Secondary | ICD-10-CM | POA: Diagnosis not present

## 2020-08-07 DIAGNOSIS — N401 Enlarged prostate with lower urinary tract symptoms: Secondary | ICD-10-CM | POA: Diagnosis not present

## 2020-08-07 DIAGNOSIS — D649 Anemia, unspecified: Secondary | ICD-10-CM | POA: Diagnosis not present

## 2020-08-07 DIAGNOSIS — M109 Gout, unspecified: Secondary | ICD-10-CM | POA: Diagnosis not present

## 2020-08-07 DIAGNOSIS — Z9181 History of falling: Secondary | ICD-10-CM | POA: Diagnosis not present

## 2020-08-07 DIAGNOSIS — Z1331 Encounter for screening for depression: Secondary | ICD-10-CM | POA: Diagnosis not present

## 2020-08-07 DIAGNOSIS — E559 Vitamin D deficiency, unspecified: Secondary | ICD-10-CM | POA: Diagnosis not present

## 2020-08-07 DIAGNOSIS — M545 Low back pain: Secondary | ICD-10-CM | POA: Diagnosis not present

## 2020-08-07 DIAGNOSIS — Z6824 Body mass index (BMI) 24.0-24.9, adult: Secondary | ICD-10-CM | POA: Diagnosis not present

## 2020-08-07 DIAGNOSIS — J449 Chronic obstructive pulmonary disease, unspecified: Secondary | ICD-10-CM | POA: Diagnosis not present

## 2020-08-07 DIAGNOSIS — Z79899 Other long term (current) drug therapy: Secondary | ICD-10-CM | POA: Diagnosis not present

## 2020-08-07 DIAGNOSIS — R739 Hyperglycemia, unspecified: Secondary | ICD-10-CM | POA: Diagnosis not present

## 2020-08-07 DIAGNOSIS — Z139 Encounter for screening, unspecified: Secondary | ICD-10-CM | POA: Diagnosis not present

## 2020-08-14 DIAGNOSIS — M419 Scoliosis, unspecified: Secondary | ICD-10-CM | POA: Diagnosis not present

## 2020-08-14 DIAGNOSIS — M47816 Spondylosis without myelopathy or radiculopathy, lumbar region: Secondary | ICD-10-CM | POA: Diagnosis not present

## 2020-08-14 DIAGNOSIS — M48061 Spinal stenosis, lumbar region without neurogenic claudication: Secondary | ICD-10-CM | POA: Diagnosis not present

## 2020-08-14 DIAGNOSIS — X58XXXA Exposure to other specified factors, initial encounter: Secondary | ICD-10-CM | POA: Diagnosis not present

## 2020-08-14 DIAGNOSIS — M5126 Other intervertebral disc displacement, lumbar region: Secondary | ICD-10-CM | POA: Diagnosis not present

## 2020-08-14 DIAGNOSIS — M5134 Other intervertebral disc degeneration, thoracic region: Secondary | ICD-10-CM | POA: Diagnosis not present

## 2020-08-14 DIAGNOSIS — S22080A Wedge compression fracture of T11-T12 vertebra, initial encounter for closed fracture: Secondary | ICD-10-CM | POA: Diagnosis not present

## 2020-08-14 DIAGNOSIS — M5124 Other intervertebral disc displacement, thoracic region: Secondary | ICD-10-CM | POA: Diagnosis not present

## 2020-08-14 DIAGNOSIS — M4856XA Collapsed vertebra, not elsewhere classified, lumbar region, initial encounter for fracture: Secondary | ICD-10-CM | POA: Diagnosis not present

## 2020-09-17 DIAGNOSIS — Z1212 Encounter for screening for malignant neoplasm of rectum: Secondary | ICD-10-CM | POA: Diagnosis not present

## 2020-09-17 DIAGNOSIS — D649 Anemia, unspecified: Secondary | ICD-10-CM | POA: Diagnosis not present

## 2020-11-26 DIAGNOSIS — J209 Acute bronchitis, unspecified: Secondary | ICD-10-CM | POA: Diagnosis not present

## 2020-11-26 DIAGNOSIS — J069 Acute upper respiratory infection, unspecified: Secondary | ICD-10-CM | POA: Diagnosis not present

## 2020-12-18 DIAGNOSIS — N401 Enlarged prostate with lower urinary tract symptoms: Secondary | ICD-10-CM | POA: Diagnosis not present

## 2020-12-18 DIAGNOSIS — J449 Chronic obstructive pulmonary disease, unspecified: Secondary | ICD-10-CM | POA: Diagnosis not present

## 2020-12-18 DIAGNOSIS — R21 Rash and other nonspecific skin eruption: Secondary | ICD-10-CM | POA: Diagnosis not present

## 2020-12-18 DIAGNOSIS — M109 Gout, unspecified: Secondary | ICD-10-CM | POA: Diagnosis not present

## 2020-12-18 DIAGNOSIS — D649 Anemia, unspecified: Secondary | ICD-10-CM | POA: Diagnosis not present

## 2020-12-18 DIAGNOSIS — E559 Vitamin D deficiency, unspecified: Secondary | ICD-10-CM | POA: Diagnosis not present

## 2020-12-18 DIAGNOSIS — R739 Hyperglycemia, unspecified: Secondary | ICD-10-CM | POA: Diagnosis not present

## 2020-12-18 DIAGNOSIS — Z6823 Body mass index (BMI) 23.0-23.9, adult: Secondary | ICD-10-CM | POA: Diagnosis not present

## 2020-12-18 DIAGNOSIS — Z79899 Other long term (current) drug therapy: Secondary | ICD-10-CM | POA: Diagnosis not present

## 2020-12-18 DIAGNOSIS — R351 Nocturia: Secondary | ICD-10-CM | POA: Diagnosis not present

## 2021-02-25 DIAGNOSIS — R300 Dysuria: Secondary | ICD-10-CM | POA: Diagnosis not present

## 2021-02-25 DIAGNOSIS — N390 Urinary tract infection, site not specified: Secondary | ICD-10-CM | POA: Diagnosis not present

## 2021-03-25 DIAGNOSIS — E559 Vitamin D deficiency, unspecified: Secondary | ICD-10-CM | POA: Diagnosis not present

## 2021-03-25 DIAGNOSIS — Z87440 Personal history of urinary (tract) infections: Secondary | ICD-10-CM | POA: Diagnosis not present

## 2021-03-25 DIAGNOSIS — N401 Enlarged prostate with lower urinary tract symptoms: Secondary | ICD-10-CM | POA: Diagnosis not present

## 2021-03-25 DIAGNOSIS — S22080A Wedge compression fracture of T11-T12 vertebra, initial encounter for closed fracture: Secondary | ICD-10-CM | POA: Diagnosis not present

## 2021-03-25 DIAGNOSIS — R351 Nocturia: Secondary | ICD-10-CM | POA: Diagnosis not present

## 2021-03-25 DIAGNOSIS — D649 Anemia, unspecified: Secondary | ICD-10-CM | POA: Diagnosis not present

## 2021-03-25 DIAGNOSIS — M109 Gout, unspecified: Secondary | ICD-10-CM | POA: Diagnosis not present

## 2021-03-25 DIAGNOSIS — J449 Chronic obstructive pulmonary disease, unspecified: Secondary | ICD-10-CM | POA: Diagnosis not present

## 2021-03-25 DIAGNOSIS — Z6824 Body mass index (BMI) 24.0-24.9, adult: Secondary | ICD-10-CM | POA: Diagnosis not present

## 2021-03-25 DIAGNOSIS — R739 Hyperglycemia, unspecified: Secondary | ICD-10-CM | POA: Diagnosis not present

## 2021-04-10 ENCOUNTER — Telehealth: Payer: Self-pay | Admitting: Oncology

## 2021-04-10 NOTE — Telephone Encounter (Signed)
Patient referred by Marlena Clipper, NP for Anemia.  Appt made 04/25/21 Labs 1:45 pm - Consult 2:15 pm

## 2021-04-25 ENCOUNTER — Other Ambulatory Visit: Payer: Self-pay

## 2021-04-25 ENCOUNTER — Telehealth: Payer: Self-pay | Admitting: Oncology

## 2021-04-25 ENCOUNTER — Inpatient Hospital Stay (INDEPENDENT_AMBULATORY_CARE_PROVIDER_SITE_OTHER): Payer: PPO | Admitting: Oncology

## 2021-04-25 ENCOUNTER — Inpatient Hospital Stay: Payer: PPO | Attending: Oncology

## 2021-04-25 ENCOUNTER — Encounter: Payer: Self-pay | Admitting: Oncology

## 2021-04-25 ENCOUNTER — Other Ambulatory Visit: Payer: Self-pay | Admitting: Oncology

## 2021-04-25 DIAGNOSIS — Z87891 Personal history of nicotine dependence: Secondary | ICD-10-CM

## 2021-04-25 DIAGNOSIS — Z800 Family history of malignant neoplasm of digestive organs: Secondary | ICD-10-CM | POA: Insufficient documentation

## 2021-04-25 DIAGNOSIS — Z79899 Other long term (current) drug therapy: Secondary | ICD-10-CM | POA: Insufficient documentation

## 2021-04-25 DIAGNOSIS — E559 Vitamin D deficiency, unspecified: Secondary | ICD-10-CM | POA: Insufficient documentation

## 2021-04-25 DIAGNOSIS — D649 Anemia, unspecified: Secondary | ICD-10-CM | POA: Diagnosis not present

## 2021-04-25 DIAGNOSIS — N402 Nodular prostate without lower urinary tract symptoms: Secondary | ICD-10-CM | POA: Insufficient documentation

## 2021-04-25 DIAGNOSIS — J449 Chronic obstructive pulmonary disease, unspecified: Secondary | ICD-10-CM | POA: Insufficient documentation

## 2021-04-25 DIAGNOSIS — D126 Benign neoplasm of colon, unspecified: Secondary | ICD-10-CM | POA: Insufficient documentation

## 2021-04-25 LAB — HEPATIC FUNCTION PANEL
ALT: 14 (ref 10–40)
AST: 25 (ref 14–40)
Alkaline Phosphatase: 73 (ref 25–125)
Bilirubin, Total: 0.4

## 2021-04-25 LAB — IRON AND TIBC
Iron: 50 ug/dL (ref 45–182)
Saturation Ratios: 20 % (ref 17.9–39.5)
TIBC: 248 ug/dL — ABNORMAL LOW (ref 250–450)
UIBC: 198 ug/dL

## 2021-04-25 LAB — FOLATE: Folate: 38.3 ng/mL (ref >5.9)

## 2021-04-25 LAB — FERRITIN: Ferritin: 44 ng/mL (ref 24–336)

## 2021-04-25 LAB — BASIC METABOLIC PANEL
BUN: 12 (ref 4–21)
CO2: 25 — AB (ref 13–22)
Chloride: 102 (ref 99–108)
Creatinine: 0.7 (ref 0.6–1.3)
Glucose: 135
Potassium: 3.9 (ref 3.4–5.3)
Sodium: 138 (ref 137–147)

## 2021-04-25 LAB — COMPREHENSIVE METABOLIC PANEL
Albumin: 3.8 (ref 3.5–5.0)
Calcium: 8.6 — AB (ref 8.7–10.7)

## 2021-04-25 LAB — CBC: RBC: 3.6 — AB (ref 3.87–5.11)

## 2021-04-25 LAB — CBC AND DIFFERENTIAL
HCT: 35 — AB (ref 41–53)
Hemoglobin: 11.2 — AB (ref 13.5–17.5)
Neutrophils Absolute: 5.81
Platelets: 269 (ref 150–399)
WBC: 8.3

## 2021-04-25 LAB — VITAMIN B12: Vitamin B-12: 154 pg/mL — ABNORMAL LOW (ref 180–914)

## 2021-04-25 NOTE — Telephone Encounter (Signed)
Per 5/12 los next appt scheduled and given to patient 

## 2021-04-25 NOTE — Progress Notes (Signed)
[facility]  [address] [place],  [place]  [address] [phone]  Clinic Day:  04/25/2021  Referring physician: Ocie Doyne., MD   HISTORY OF PRESENT ILLNESS:  The patient is a 87 y.o. male  who I was asked to consult upon for anemia.  Recent labs showed a mildly low hemoglobin of 11.8.  According to his daughter, he has had mild anemia for the past few years.  He has been taking an iron pill daily for the past 4 weeks.  He denies having any overt forms of blood loss to explain his anemia.  His last colonoscopy was 13 years ago, which was normal.  His daughter claims Hemoccult testing was done in January 2022, which came back negative.  To his knowledge, there is no family history of anemia.    PAST MEDICAL HISTORY:   Past Medical History:  Diagnosis Date  . COPD (chronic obstructive pulmonary disease) ([place])   . Diabetes mellitus without complication ([place])     PAST SURGICAL HISTORY:   Past Surgical History:  Procedure Laterality Date  . ANKLE SURGERY Left   . CATARACT EXTRACTION, BILATERAL    . HERNIA REPAIR Bilateral    INGUINAL - RIGHT X 2  . OTHER SURGICAL HISTORY     VOCAL CORD POLYPECTOMY X 2  . REFRACTIVE SURGERY      CURRENT MEDICATIONS:   Current Outpatient Medications  Medication Sig Dispense Refill  . albuterol (VENTOLIN HFA) 108 (90 Base) MCG/ACT inhaler INHALE 2 PUFFS BY MOUTH EVERY 6 HOURS AS NEEDED FOR DIFFICULTY BREATHING    . calcium-vitamin D (OSCAL WITH D) 500-200 MG-UNIT TABS tablet Take 1 tablet by mouth 2 (two) times daily.    . Cholecalciferol 25 MCG (1000 UT) tablet Take 1 tablet by mouth 2 (two) times daily.    . finasteride (PROSCAR) 5 MG tablet Take 1 tablet by mouth daily.    . fluticasone furoate-vilanterol (BREO ELLIPTA) 100-25 MCG/INH AEPB     . ipratropium-albuterol (DUONEB) 0.5-2.5 (3) MG/3ML SOLN     . metFORMIN (GLUCOPHAGE) 500 MG tablet Take 1 tablet by mouth every 12 (twelve) hours.    .  pravastatin (PRAVACHOL) 40 MG tablet Take 1 tablet by mouth at bedtime.    Marland Kitchen alendronate (FOSAMAX) 70 MG tablet Take 70 mg by mouth once a week.    . triamcinolone cream (KENALOG) 0.1 % Apply topically.     No current facility-administered medications for this visit.    ALLERGIES:   Allergies  Allergen Reactions  . Corticosteroids   . Sulfa Antibiotics     FAMILY HISTORY:   Family History  Problem Relation Age of Onset  . Heart attack Father   . Alzheimer's disease Sister   . Gastric cancer Brother   . Stroke Sister   . Colon cancer Sister   . Diabetes Sister   . Other Sister   . Other Sister   . Renal Disease Sister   . Other Brother     SOCIAL HISTORY:  The patient was born and raised in [place].  He lives in town with his daughter.  He is widowed, with 7 grand/great-grandchildren.  He worked at a Medical sales representative for 39 years.  He smoked less than a pack of cigarettes daily x 40 years, but quit smoking 20 years ago.  He drank alcohol in the remote past.  He served in [employer] for 2 years.  REVIEW OF SYSTEMS:  Review of Systems  Constitutional: Negative for fatigue, fever and unexpected weight change.  HENT:   Positive for hearing loss.   Eyes: Positive for eye problems.  Respiratory: Positive for cough and shortness of breath. Negative for chest tightness and hemoptysis.   Cardiovascular: Negative for chest pain and palpitations.  Gastrointestinal: Negative for abdominal distention, abdominal pain, blood in stool, constipation, diarrhea, nausea and vomiting.  Genitourinary: Negative for dysuria, frequency and hematuria.   Musculoskeletal: Negative for arthralgias, back pain and myalgias.  Skin: Negative for itching and rash.  Neurological: Negative for dizziness, headaches and light-headedness.  Psychiatric/Behavioral: Negative for depression and suicidal ideas. The patient is not nervous/anxious.      PHYSICAL EXAM:  Blood pressure (!) 172/95, pulse  84, temperature 98.6 F (37 C), resp. rate 18, height 6' (1.829 m), weight 178 lb 1.6 oz (80.8 kg), SpO2 94 %. Wt Readings from Last 3 Encounters:  04/25/21 178 lb 1.6 oz (80.8 kg)  02/19/17 188 lb (85.3 kg)   Body mass index is 24.15 kg/m. Performance status (ECOG): 2 - Symptomatic, <50% confined to bed Physical Exam Constitutional:      Appearance: Normal appearance. He is not ill-appearing.     Comments: He ambulates with a walker and is wearing oxygen per nasal canula  HENT:     Mouth/Throat:     Mouth: Mucous membranes are moist.     Pharynx: Oropharynx is clear. No oropharyngeal exudate or posterior oropharyngeal erythema.  Cardiovascular:     Rate and Rhythm: Normal rate and regular rhythm.     Heart sounds: No murmur heard. No friction rub. No gallop.   Pulmonary:     Effort: Pulmonary effort is normal. No respiratory distress.     Breath sounds: Normal breath sounds. No wheezing, rhonchi or rales.  Chest:  Breasts:     Right: No axillary adenopathy or supraclavicular adenopathy.     Left: No axillary adenopathy or supraclavicular adenopathy.    Abdominal:     General: Bowel sounds are normal. There is no distension.     Palpations: Abdomen is soft. There is no mass.     Tenderness: There is no abdominal tenderness.  Musculoskeletal:        General: No swelling.     Right lower leg: No edema.     Left lower leg: No edema.  Lymphadenopathy:     Cervical: No cervical adenopathy.     Upper Body:     Right upper body: No supraclavicular or axillary adenopathy.     Left upper body: No supraclavicular or axillary adenopathy.     Lower Body: No right inguinal adenopathy. No left inguinal adenopathy.  Skin:    General: Skin is warm.     Coloration: Skin is not jaundiced.     Findings: No lesion or rash.  Neurological:     General: No focal deficit present.     Mental Status: He is alert and oriented to person, place, and time. Mental status is at baseline.      Cranial Nerves: Cranial nerves are intact.  Psychiatric:        Mood and Affect: Mood normal.        Behavior: Behavior normal.        Thought Content: Thought content normal.    LABS:   CBC Latest Ref Rng & Units 04/25/2021  WBC - 8.3  Hemoglobin 13.5 - 17.5 11.2(A)  Hematocrit 41 - 53 35(A)  Platelets 150 - 399 269   CMP Latest Ref  Rng & Units 04/25/2021  BUN 4 - 21 12  Creatinine 0.6 - 1.3 0.7  Sodium 137 - 147 138  Potassium 3.4 - 5.3 3.9  Chloride 99 - 108 102  CO2 13 - 22 25(A)  Calcium 8.7 - 10.7 8.6(A)  Alkaline Phos 25 - 125 73  AST 14 - 40 25  ALT 10 - 40 14    ASSESSMENT & PLAN:  A 87 y.o. male who I was asked to consult upon for mild anemia.  I will check his iron, B12 and folate levels to ensure there are no nutritional deficiencies factoring into his anemia.  Based upon his kidney parameters, he has no evidence of renal insufficiency, which can cause anemia.  Of note, a serum protein electrophoresis done recently did not reveal a monoclonal spike to suggest an underlying plasma cell dyscrasia, such as multiple myeloma, is not behind his anemia.  Overall, his anemia is mild to where no immediate intervention is necessary at this time.  I will see him back in 3 weeks to go over his labs collected today, as well as to recheck his hemoglobin at that time.  The patient understands all the plans discussed today and is in agreement with them.  I do appreciate Collier Salina, NP,  for his new consult.   Gizella Belleville Macarthur Critchley, MD

## 2021-05-12 DIAGNOSIS — D72829 Elevated white blood cell count, unspecified: Secondary | ICD-10-CM | POA: Diagnosis not present

## 2021-05-12 DIAGNOSIS — J9811 Atelectasis: Secondary | ICD-10-CM | POA: Diagnosis not present

## 2021-05-12 DIAGNOSIS — Z515 Encounter for palliative care: Secondary | ICD-10-CM | POA: Diagnosis not present

## 2021-05-12 DIAGNOSIS — J189 Pneumonia, unspecified organism: Secondary | ICD-10-CM | POA: Diagnosis not present

## 2021-05-12 DIAGNOSIS — I6523 Occlusion and stenosis of bilateral carotid arteries: Secondary | ICD-10-CM | POA: Diagnosis not present

## 2021-05-12 DIAGNOSIS — R7989 Other specified abnormal findings of blood chemistry: Secondary | ICD-10-CM | POA: Diagnosis not present

## 2021-05-12 DIAGNOSIS — J690 Pneumonitis due to inhalation of food and vomit: Secondary | ICD-10-CM | POA: Diagnosis not present

## 2021-05-12 DIAGNOSIS — I4891 Unspecified atrial fibrillation: Secondary | ICD-10-CM | POA: Diagnosis not present

## 2021-05-12 DIAGNOSIS — J9621 Acute and chronic respiratory failure with hypoxia: Secondary | ICD-10-CM | POA: Diagnosis not present

## 2021-05-12 DIAGNOSIS — S2249XA Multiple fractures of ribs, unspecified side, initial encounter for closed fracture: Secondary | ICD-10-CM | POA: Diagnosis not present

## 2021-05-12 DIAGNOSIS — I4892 Unspecified atrial flutter: Secondary | ICD-10-CM | POA: Diagnosis not present

## 2021-05-12 DIAGNOSIS — J441 Chronic obstructive pulmonary disease with (acute) exacerbation: Secondary | ICD-10-CM | POA: Diagnosis not present

## 2021-05-12 DIAGNOSIS — R509 Fever, unspecified: Secondary | ICD-10-CM | POA: Diagnosis not present

## 2021-05-12 DIAGNOSIS — I700 Atherosclerosis of aorta: Secondary | ICD-10-CM | POA: Diagnosis not present

## 2021-05-12 DIAGNOSIS — M1991 Primary osteoarthritis, unspecified site: Secondary | ICD-10-CM | POA: Diagnosis not present

## 2021-05-12 DIAGNOSIS — T17500A Unspecified foreign body in bronchus causing asphyxiation, initial encounter: Secondary | ICD-10-CM | POA: Diagnosis not present

## 2021-05-12 DIAGNOSIS — J439 Emphysema, unspecified: Secondary | ICD-10-CM | POA: Diagnosis not present

## 2021-05-12 DIAGNOSIS — E119 Type 2 diabetes mellitus without complications: Secondary | ICD-10-CM | POA: Diagnosis not present

## 2021-05-12 DIAGNOSIS — I361 Nonrheumatic tricuspid (valve) insufficiency: Secondary | ICD-10-CM | POA: Diagnosis not present

## 2021-05-12 DIAGNOSIS — N400 Enlarged prostate without lower urinary tract symptoms: Secondary | ICD-10-CM | POA: Diagnosis not present

## 2021-05-12 DIAGNOSIS — I517 Cardiomegaly: Secondary | ICD-10-CM | POA: Diagnosis not present

## 2021-05-12 DIAGNOSIS — Z743 Need for continuous supervision: Secondary | ICD-10-CM | POA: Diagnosis not present

## 2021-05-12 DIAGNOSIS — J90 Pleural effusion, not elsewhere classified: Secondary | ICD-10-CM | POA: Diagnosis not present

## 2021-05-12 DIAGNOSIS — R29818 Other symptoms and signs involving the nervous system: Secondary | ICD-10-CM | POA: Diagnosis not present

## 2021-05-12 DIAGNOSIS — R0902 Hypoxemia: Secondary | ICD-10-CM | POA: Diagnosis not present

## 2021-05-12 DIAGNOSIS — I10 Essential (primary) hypertension: Secondary | ICD-10-CM | POA: Diagnosis not present

## 2021-05-12 DIAGNOSIS — T502X5A Adverse effect of carbonic-anhydrase inhibitors, benzothiadiazides and other diuretics, initial encounter: Secondary | ICD-10-CM | POA: Diagnosis not present

## 2021-05-12 DIAGNOSIS — R0600 Dyspnea, unspecified: Secondary | ICD-10-CM | POA: Diagnosis not present

## 2021-05-12 DIAGNOSIS — A419 Sepsis, unspecified organism: Secondary | ICD-10-CM | POA: Diagnosis not present

## 2021-05-12 DIAGNOSIS — E785 Hyperlipidemia, unspecified: Secondary | ICD-10-CM | POA: Diagnosis not present

## 2021-05-12 DIAGNOSIS — J9690 Respiratory failure, unspecified, unspecified whether with hypoxia or hypercapnia: Secondary | ICD-10-CM | POA: Diagnosis not present

## 2021-05-12 DIAGNOSIS — S2241XA Multiple fractures of ribs, right side, initial encounter for closed fracture: Secondary | ICD-10-CM | POA: Diagnosis not present

## 2021-05-12 DIAGNOSIS — S2243XA Multiple fractures of ribs, bilateral, initial encounter for closed fracture: Secondary | ICD-10-CM | POA: Diagnosis not present

## 2021-05-12 DIAGNOSIS — G9349 Other encephalopathy: Secondary | ICD-10-CM | POA: Diagnosis not present

## 2021-05-12 DIAGNOSIS — R410 Disorientation, unspecified: Secondary | ICD-10-CM | POA: Diagnosis not present

## 2021-05-12 DIAGNOSIS — Z66 Do not resuscitate: Secondary | ICD-10-CM | POA: Diagnosis not present

## 2021-05-12 DIAGNOSIS — R000 Tachycardia, unspecified: Secondary | ICD-10-CM | POA: Diagnosis not present

## 2021-05-12 DIAGNOSIS — R29810 Facial weakness: Secondary | ICD-10-CM | POA: Diagnosis not present

## 2021-05-12 DIAGNOSIS — R918 Other nonspecific abnormal finding of lung field: Secondary | ICD-10-CM | POA: Diagnosis not present

## 2021-05-12 DIAGNOSIS — J811 Chronic pulmonary edema: Secondary | ICD-10-CM | POA: Diagnosis not present

## 2021-05-12 DIAGNOSIS — W19XXXA Unspecified fall, initial encounter: Secondary | ICD-10-CM | POA: Diagnosis not present

## 2021-05-12 DIAGNOSIS — Z9981 Dependence on supplemental oxygen: Secondary | ICD-10-CM | POA: Diagnosis not present

## 2021-05-12 DIAGNOSIS — S299XXA Unspecified injury of thorax, initial encounter: Secondary | ICD-10-CM | POA: Diagnosis not present

## 2021-05-12 DIAGNOSIS — J9600 Acute respiratory failure, unspecified whether with hypoxia or hypercapnia: Secondary | ICD-10-CM | POA: Diagnosis not present

## 2021-05-12 DIAGNOSIS — I272 Pulmonary hypertension, unspecified: Secondary | ICD-10-CM | POA: Diagnosis not present

## 2021-05-12 DIAGNOSIS — R0602 Shortness of breath: Secondary | ICD-10-CM | POA: Diagnosis not present

## 2021-05-12 DIAGNOSIS — E1165 Type 2 diabetes mellitus with hyperglycemia: Secondary | ICD-10-CM | POA: Diagnosis not present

## 2021-05-12 DIAGNOSIS — E871 Hypo-osmolality and hyponatremia: Secondary | ICD-10-CM | POA: Diagnosis not present

## 2021-05-12 DIAGNOSIS — R6520 Severe sepsis without septic shock: Secondary | ICD-10-CM | POA: Diagnosis not present

## 2021-05-12 DIAGNOSIS — J9620 Acute and chronic respiratory failure, unspecified whether with hypoxia or hypercapnia: Secondary | ICD-10-CM | POA: Diagnosis not present

## 2021-05-12 DIAGNOSIS — G8324 Monoplegia of upper limb affecting left nondominant side: Secondary | ICD-10-CM | POA: Diagnosis not present

## 2021-05-12 DIAGNOSIS — J9601 Acute respiratory failure with hypoxia: Secondary | ICD-10-CM | POA: Diagnosis not present

## 2021-05-12 DIAGNOSIS — J449 Chronic obstructive pulmonary disease, unspecified: Secondary | ICD-10-CM | POA: Diagnosis not present

## 2021-05-13 DIAGNOSIS — J9621 Acute and chronic respiratory failure with hypoxia: Secondary | ICD-10-CM | POA: Diagnosis not present

## 2021-05-13 DIAGNOSIS — S2249XA Multiple fractures of ribs, unspecified side, initial encounter for closed fracture: Secondary | ICD-10-CM | POA: Diagnosis not present

## 2021-05-13 DIAGNOSIS — A419 Sepsis, unspecified organism: Secondary | ICD-10-CM | POA: Diagnosis not present

## 2021-05-13 DIAGNOSIS — J690 Pneumonitis due to inhalation of food and vomit: Secondary | ICD-10-CM | POA: Diagnosis not present

## 2021-05-13 DIAGNOSIS — R918 Other nonspecific abnormal finding of lung field: Secondary | ICD-10-CM | POA: Diagnosis not present

## 2021-05-14 DIAGNOSIS — J9621 Acute and chronic respiratory failure with hypoxia: Secondary | ICD-10-CM | POA: Diagnosis not present

## 2021-05-14 DIAGNOSIS — J690 Pneumonitis due to inhalation of food and vomit: Secondary | ICD-10-CM | POA: Diagnosis not present

## 2021-05-14 DIAGNOSIS — A419 Sepsis, unspecified organism: Secondary | ICD-10-CM | POA: Diagnosis not present

## 2021-05-15 DIAGNOSIS — A419 Sepsis, unspecified organism: Secondary | ICD-10-CM | POA: Diagnosis not present

## 2021-05-15 DIAGNOSIS — J9811 Atelectasis: Secondary | ICD-10-CM | POA: Diagnosis not present

## 2021-05-15 DIAGNOSIS — J9621 Acute and chronic respiratory failure with hypoxia: Secondary | ICD-10-CM | POA: Diagnosis not present

## 2021-05-15 DIAGNOSIS — J690 Pneumonitis due to inhalation of food and vomit: Secondary | ICD-10-CM | POA: Diagnosis not present

## 2021-05-15 DIAGNOSIS — I361 Nonrheumatic tricuspid (valve) insufficiency: Secondary | ICD-10-CM | POA: Diagnosis not present

## 2021-05-15 DIAGNOSIS — I4891 Unspecified atrial fibrillation: Secondary | ICD-10-CM

## 2021-05-15 DIAGNOSIS — J439 Emphysema, unspecified: Secondary | ICD-10-CM | POA: Diagnosis not present

## 2021-05-15 DIAGNOSIS — J90 Pleural effusion, not elsewhere classified: Secondary | ICD-10-CM | POA: Diagnosis not present

## 2021-05-16 ENCOUNTER — Inpatient Hospital Stay: Payer: PPO

## 2021-05-16 ENCOUNTER — Inpatient Hospital Stay: Payer: PPO | Admitting: Oncology

## 2021-05-16 DIAGNOSIS — J9621 Acute and chronic respiratory failure with hypoxia: Secondary | ICD-10-CM | POA: Diagnosis not present

## 2021-05-16 DIAGNOSIS — A419 Sepsis, unspecified organism: Secondary | ICD-10-CM | POA: Diagnosis not present

## 2021-05-16 DIAGNOSIS — J690 Pneumonitis due to inhalation of food and vomit: Secondary | ICD-10-CM | POA: Diagnosis not present

## 2021-05-16 DIAGNOSIS — I4891 Unspecified atrial fibrillation: Secondary | ICD-10-CM | POA: Diagnosis not present

## 2021-05-16 DIAGNOSIS — I361 Nonrheumatic tricuspid (valve) insufficiency: Secondary | ICD-10-CM

## 2021-05-17 DIAGNOSIS — J690 Pneumonitis due to inhalation of food and vomit: Secondary | ICD-10-CM | POA: Diagnosis not present

## 2021-05-17 DIAGNOSIS — J9621 Acute and chronic respiratory failure with hypoxia: Secondary | ICD-10-CM | POA: Diagnosis not present

## 2021-05-17 DIAGNOSIS — A419 Sepsis, unspecified organism: Secondary | ICD-10-CM | POA: Diagnosis not present

## 2021-05-18 DIAGNOSIS — J90 Pleural effusion, not elsewhere classified: Secondary | ICD-10-CM | POA: Diagnosis not present

## 2021-05-18 DIAGNOSIS — J9811 Atelectasis: Secondary | ICD-10-CM | POA: Diagnosis not present

## 2021-05-18 DIAGNOSIS — J449 Chronic obstructive pulmonary disease, unspecified: Secondary | ICD-10-CM | POA: Diagnosis not present

## 2021-05-18 DIAGNOSIS — A419 Sepsis, unspecified organism: Secondary | ICD-10-CM | POA: Diagnosis not present

## 2021-05-18 DIAGNOSIS — R0600 Dyspnea, unspecified: Secondary | ICD-10-CM | POA: Diagnosis not present

## 2021-05-18 DIAGNOSIS — J9621 Acute and chronic respiratory failure with hypoxia: Secondary | ICD-10-CM | POA: Diagnosis not present

## 2021-05-18 DIAGNOSIS — J690 Pneumonitis due to inhalation of food and vomit: Secondary | ICD-10-CM | POA: Diagnosis not present

## 2021-05-19 DIAGNOSIS — A419 Sepsis, unspecified organism: Secondary | ICD-10-CM | POA: Diagnosis not present

## 2021-05-19 DIAGNOSIS — J690 Pneumonitis due to inhalation of food and vomit: Secondary | ICD-10-CM | POA: Diagnosis not present

## 2021-05-19 DIAGNOSIS — J9621 Acute and chronic respiratory failure with hypoxia: Secondary | ICD-10-CM | POA: Diagnosis not present

## 2021-05-20 DIAGNOSIS — I517 Cardiomegaly: Secondary | ICD-10-CM | POA: Diagnosis not present

## 2021-05-20 DIAGNOSIS — A419 Sepsis, unspecified organism: Secondary | ICD-10-CM | POA: Diagnosis not present

## 2021-05-20 DIAGNOSIS — J90 Pleural effusion, not elsewhere classified: Secondary | ICD-10-CM | POA: Diagnosis not present

## 2021-05-20 DIAGNOSIS — R0602 Shortness of breath: Secondary | ICD-10-CM | POA: Diagnosis not present

## 2021-05-20 DIAGNOSIS — J9621 Acute and chronic respiratory failure with hypoxia: Secondary | ICD-10-CM | POA: Diagnosis not present

## 2021-05-20 DIAGNOSIS — J690 Pneumonitis due to inhalation of food and vomit: Secondary | ICD-10-CM | POA: Diagnosis not present

## 2021-05-21 DIAGNOSIS — J9621 Acute and chronic respiratory failure with hypoxia: Secondary | ICD-10-CM | POA: Diagnosis not present

## 2021-05-21 DIAGNOSIS — J690 Pneumonitis due to inhalation of food and vomit: Secondary | ICD-10-CM | POA: Diagnosis not present

## 2021-05-21 DIAGNOSIS — A419 Sepsis, unspecified organism: Secondary | ICD-10-CM | POA: Diagnosis not present

## 2021-05-22 DIAGNOSIS — J690 Pneumonitis due to inhalation of food and vomit: Secondary | ICD-10-CM | POA: Diagnosis not present

## 2021-05-22 DIAGNOSIS — A419 Sepsis, unspecified organism: Secondary | ICD-10-CM | POA: Diagnosis not present

## 2021-05-22 DIAGNOSIS — J9621 Acute and chronic respiratory failure with hypoxia: Secondary | ICD-10-CM | POA: Diagnosis not present

## 2021-05-23 DIAGNOSIS — Z9981 Dependence on supplemental oxygen: Secondary | ICD-10-CM | POA: Diagnosis not present

## 2021-05-23 DIAGNOSIS — J189 Pneumonia, unspecified organism: Secondary | ICD-10-CM | POA: Diagnosis not present

## 2021-05-23 DIAGNOSIS — J690 Pneumonitis due to inhalation of food and vomit: Secondary | ICD-10-CM | POA: Diagnosis not present

## 2021-05-23 DIAGNOSIS — J449 Chronic obstructive pulmonary disease, unspecified: Secondary | ICD-10-CM | POA: Diagnosis not present

## 2021-05-23 DIAGNOSIS — A419 Sepsis, unspecified organism: Secondary | ICD-10-CM | POA: Diagnosis not present

## 2021-05-23 DIAGNOSIS — J9621 Acute and chronic respiratory failure with hypoxia: Secondary | ICD-10-CM | POA: Diagnosis not present

## 2021-05-23 DIAGNOSIS — T17500A Unspecified foreign body in bronchus causing asphyxiation, initial encounter: Secondary | ICD-10-CM | POA: Diagnosis not present

## 2021-05-23 DIAGNOSIS — J9620 Acute and chronic respiratory failure, unspecified whether with hypoxia or hypercapnia: Secondary | ICD-10-CM | POA: Diagnosis not present

## 2021-05-23 DIAGNOSIS — R0902 Hypoxemia: Secondary | ICD-10-CM | POA: Diagnosis not present

## 2021-05-24 DIAGNOSIS — J9620 Acute and chronic respiratory failure, unspecified whether with hypoxia or hypercapnia: Secondary | ICD-10-CM | POA: Diagnosis not present

## 2021-05-24 DIAGNOSIS — J9690 Respiratory failure, unspecified, unspecified whether with hypoxia or hypercapnia: Secondary | ICD-10-CM | POA: Diagnosis not present

## 2021-05-24 DIAGNOSIS — J189 Pneumonia, unspecified organism: Secondary | ICD-10-CM | POA: Diagnosis not present

## 2021-05-24 DIAGNOSIS — J90 Pleural effusion, not elsewhere classified: Secondary | ICD-10-CM | POA: Diagnosis not present

## 2021-05-24 DIAGNOSIS — J690 Pneumonitis due to inhalation of food and vomit: Secondary | ICD-10-CM | POA: Diagnosis not present

## 2021-05-24 DIAGNOSIS — A419 Sepsis, unspecified organism: Secondary | ICD-10-CM | POA: Diagnosis not present

## 2021-05-24 DIAGNOSIS — J9621 Acute and chronic respiratory failure with hypoxia: Secondary | ICD-10-CM | POA: Diagnosis not present

## 2021-05-24 DIAGNOSIS — J449 Chronic obstructive pulmonary disease, unspecified: Secondary | ICD-10-CM | POA: Diagnosis not present

## 2021-05-24 DIAGNOSIS — T17500A Unspecified foreign body in bronchus causing asphyxiation, initial encounter: Secondary | ICD-10-CM | POA: Diagnosis not present

## 2021-05-24 DIAGNOSIS — Z9981 Dependence on supplemental oxygen: Secondary | ICD-10-CM | POA: Diagnosis not present

## 2021-05-25 DIAGNOSIS — J9621 Acute and chronic respiratory failure with hypoxia: Secondary | ICD-10-CM | POA: Diagnosis not present

## 2021-05-25 DIAGNOSIS — J449 Chronic obstructive pulmonary disease, unspecified: Secondary | ICD-10-CM | POA: Diagnosis not present

## 2021-05-25 DIAGNOSIS — J189 Pneumonia, unspecified organism: Secondary | ICD-10-CM | POA: Diagnosis not present

## 2021-05-25 DIAGNOSIS — R7989 Other specified abnormal findings of blood chemistry: Secondary | ICD-10-CM | POA: Diagnosis not present

## 2021-05-25 DIAGNOSIS — A419 Sepsis, unspecified organism: Secondary | ICD-10-CM | POA: Diagnosis not present

## 2021-05-25 DIAGNOSIS — I4892 Unspecified atrial flutter: Secondary | ICD-10-CM | POA: Diagnosis not present

## 2021-05-25 DIAGNOSIS — J690 Pneumonitis due to inhalation of food and vomit: Secondary | ICD-10-CM | POA: Diagnosis not present

## 2021-05-26 DIAGNOSIS — J690 Pneumonitis due to inhalation of food and vomit: Secondary | ICD-10-CM | POA: Diagnosis not present

## 2021-05-26 DIAGNOSIS — I517 Cardiomegaly: Secondary | ICD-10-CM | POA: Diagnosis not present

## 2021-05-26 DIAGNOSIS — J9690 Respiratory failure, unspecified, unspecified whether with hypoxia or hypercapnia: Secondary | ICD-10-CM | POA: Diagnosis not present

## 2021-05-26 DIAGNOSIS — R29818 Other symptoms and signs involving the nervous system: Secondary | ICD-10-CM | POA: Diagnosis not present

## 2021-05-26 DIAGNOSIS — J9621 Acute and chronic respiratory failure with hypoxia: Secondary | ICD-10-CM | POA: Diagnosis not present

## 2021-05-26 DIAGNOSIS — J90 Pleural effusion, not elsewhere classified: Secondary | ICD-10-CM | POA: Diagnosis not present

## 2021-05-26 DIAGNOSIS — I6523 Occlusion and stenosis of bilateral carotid arteries: Secondary | ICD-10-CM | POA: Diagnosis not present

## 2021-05-26 DIAGNOSIS — J811 Chronic pulmonary edema: Secondary | ICD-10-CM | POA: Diagnosis not present

## 2021-05-26 DIAGNOSIS — A419 Sepsis, unspecified organism: Secondary | ICD-10-CM | POA: Diagnosis not present

## 2021-05-27 DIAGNOSIS — Z9981 Dependence on supplemental oxygen: Secondary | ICD-10-CM | POA: Diagnosis not present

## 2021-05-27 DIAGNOSIS — T17500A Unspecified foreign body in bronchus causing asphyxiation, initial encounter: Secondary | ICD-10-CM | POA: Diagnosis not present

## 2021-05-27 DIAGNOSIS — J90 Pleural effusion, not elsewhere classified: Secondary | ICD-10-CM | POA: Diagnosis not present

## 2021-05-27 DIAGNOSIS — J9621 Acute and chronic respiratory failure with hypoxia: Secondary | ICD-10-CM | POA: Diagnosis not present

## 2021-05-27 DIAGNOSIS — A419 Sepsis, unspecified organism: Secondary | ICD-10-CM | POA: Diagnosis not present

## 2021-05-27 DIAGNOSIS — I4892 Unspecified atrial flutter: Secondary | ICD-10-CM

## 2021-05-27 DIAGNOSIS — J690 Pneumonitis due to inhalation of food and vomit: Secondary | ICD-10-CM | POA: Diagnosis not present

## 2021-05-27 DIAGNOSIS — R509 Fever, unspecified: Secondary | ICD-10-CM | POA: Diagnosis not present

## 2021-05-27 DIAGNOSIS — R000 Tachycardia, unspecified: Secondary | ICD-10-CM | POA: Diagnosis not present

## 2021-05-27 DIAGNOSIS — J449 Chronic obstructive pulmonary disease, unspecified: Secondary | ICD-10-CM

## 2021-05-27 DIAGNOSIS — J9620 Acute and chronic respiratory failure, unspecified whether with hypoxia or hypercapnia: Secondary | ICD-10-CM | POA: Diagnosis not present

## 2021-05-27 DIAGNOSIS — J189 Pneumonia, unspecified organism: Secondary | ICD-10-CM

## 2021-05-27 DIAGNOSIS — R7989 Other specified abnormal findings of blood chemistry: Secondary | ICD-10-CM

## 2021-05-28 DIAGNOSIS — J690 Pneumonitis due to inhalation of food and vomit: Secondary | ICD-10-CM | POA: Diagnosis not present

## 2021-05-28 DIAGNOSIS — J449 Chronic obstructive pulmonary disease, unspecified: Secondary | ICD-10-CM | POA: Diagnosis not present

## 2021-05-28 DIAGNOSIS — J189 Pneumonia, unspecified organism: Secondary | ICD-10-CM | POA: Diagnosis not present

## 2021-05-28 DIAGNOSIS — J90 Pleural effusion, not elsewhere classified: Secondary | ICD-10-CM | POA: Diagnosis not present

## 2021-05-28 DIAGNOSIS — J9690 Respiratory failure, unspecified, unspecified whether with hypoxia or hypercapnia: Secondary | ICD-10-CM | POA: Diagnosis not present

## 2021-05-28 DIAGNOSIS — Z9981 Dependence on supplemental oxygen: Secondary | ICD-10-CM | POA: Diagnosis not present

## 2021-05-28 DIAGNOSIS — A419 Sepsis, unspecified organism: Secondary | ICD-10-CM | POA: Diagnosis not present

## 2021-05-28 DIAGNOSIS — R7989 Other specified abnormal findings of blood chemistry: Secondary | ICD-10-CM | POA: Diagnosis not present

## 2021-05-28 DIAGNOSIS — T17500A Unspecified foreign body in bronchus causing asphyxiation, initial encounter: Secondary | ICD-10-CM | POA: Diagnosis not present

## 2021-05-28 DIAGNOSIS — J9620 Acute and chronic respiratory failure, unspecified whether with hypoxia or hypercapnia: Secondary | ICD-10-CM | POA: Diagnosis not present

## 2021-05-28 DIAGNOSIS — J9621 Acute and chronic respiratory failure with hypoxia: Secondary | ICD-10-CM | POA: Diagnosis not present

## 2021-05-28 DIAGNOSIS — I4892 Unspecified atrial flutter: Secondary | ICD-10-CM | POA: Diagnosis not present

## 2021-05-29 DIAGNOSIS — R0902 Hypoxemia: Secondary | ICD-10-CM | POA: Diagnosis not present

## 2021-05-29 DIAGNOSIS — J690 Pneumonitis due to inhalation of food and vomit: Secondary | ICD-10-CM | POA: Diagnosis not present

## 2021-05-29 DIAGNOSIS — J449 Chronic obstructive pulmonary disease, unspecified: Secondary | ICD-10-CM | POA: Diagnosis not present

## 2021-05-29 DIAGNOSIS — I4892 Unspecified atrial flutter: Secondary | ICD-10-CM | POA: Diagnosis not present

## 2021-05-29 DIAGNOSIS — R7989 Other specified abnormal findings of blood chemistry: Secondary | ICD-10-CM | POA: Diagnosis not present

## 2021-05-29 DIAGNOSIS — T17500A Unspecified foreign body in bronchus causing asphyxiation, initial encounter: Secondary | ICD-10-CM | POA: Diagnosis not present

## 2021-05-29 DIAGNOSIS — A419 Sepsis, unspecified organism: Secondary | ICD-10-CM | POA: Diagnosis not present

## 2021-05-29 DIAGNOSIS — Z9981 Dependence on supplemental oxygen: Secondary | ICD-10-CM | POA: Diagnosis not present

## 2021-05-29 DIAGNOSIS — J9620 Acute and chronic respiratory failure, unspecified whether with hypoxia or hypercapnia: Secondary | ICD-10-CM | POA: Diagnosis not present

## 2021-05-29 DIAGNOSIS — J90 Pleural effusion, not elsewhere classified: Secondary | ICD-10-CM | POA: Diagnosis not present

## 2021-05-29 DIAGNOSIS — J189 Pneumonia, unspecified organism: Secondary | ICD-10-CM | POA: Diagnosis not present

## 2021-05-29 DIAGNOSIS — J9621 Acute and chronic respiratory failure with hypoxia: Secondary | ICD-10-CM | POA: Diagnosis not present

## 2021-05-30 DIAGNOSIS — I4892 Unspecified atrial flutter: Secondary | ICD-10-CM | POA: Diagnosis not present

## 2021-05-30 DIAGNOSIS — J9621 Acute and chronic respiratory failure with hypoxia: Secondary | ICD-10-CM | POA: Diagnosis not present

## 2021-05-30 DIAGNOSIS — R7989 Other specified abnormal findings of blood chemistry: Secondary | ICD-10-CM | POA: Diagnosis not present

## 2021-05-30 DIAGNOSIS — J690 Pneumonitis due to inhalation of food and vomit: Secondary | ICD-10-CM | POA: Diagnosis not present

## 2021-05-30 DIAGNOSIS — J189 Pneumonia, unspecified organism: Secondary | ICD-10-CM | POA: Diagnosis not present

## 2021-05-30 DIAGNOSIS — J449 Chronic obstructive pulmonary disease, unspecified: Secondary | ICD-10-CM | POA: Diagnosis not present

## 2021-05-30 DIAGNOSIS — A419 Sepsis, unspecified organism: Secondary | ICD-10-CM | POA: Diagnosis not present

## 2021-05-31 DIAGNOSIS — J690 Pneumonitis due to inhalation of food and vomit: Secondary | ICD-10-CM | POA: Diagnosis not present

## 2021-05-31 DIAGNOSIS — A419 Sepsis, unspecified organism: Secondary | ICD-10-CM | POA: Diagnosis not present

## 2021-05-31 DIAGNOSIS — J9621 Acute and chronic respiratory failure with hypoxia: Secondary | ICD-10-CM | POA: Diagnosis not present

## 2021-06-01 DIAGNOSIS — A419 Sepsis, unspecified organism: Secondary | ICD-10-CM | POA: Diagnosis not present

## 2021-06-01 DIAGNOSIS — J690 Pneumonitis due to inhalation of food and vomit: Secondary | ICD-10-CM | POA: Diagnosis not present

## 2021-06-01 DIAGNOSIS — J9621 Acute and chronic respiratory failure with hypoxia: Secondary | ICD-10-CM | POA: Diagnosis not present

## 2021-06-02 DIAGNOSIS — J690 Pneumonitis due to inhalation of food and vomit: Secondary | ICD-10-CM | POA: Diagnosis not present

## 2021-06-02 DIAGNOSIS — J9621 Acute and chronic respiratory failure with hypoxia: Secondary | ICD-10-CM | POA: Diagnosis not present

## 2021-06-02 DIAGNOSIS — A419 Sepsis, unspecified organism: Secondary | ICD-10-CM | POA: Diagnosis not present

## 2021-06-03 DIAGNOSIS — A419 Sepsis, unspecified organism: Secondary | ICD-10-CM | POA: Diagnosis not present

## 2021-06-03 DIAGNOSIS — J690 Pneumonitis due to inhalation of food and vomit: Secondary | ICD-10-CM | POA: Diagnosis not present

## 2021-06-03 DIAGNOSIS — J9621 Acute and chronic respiratory failure with hypoxia: Secondary | ICD-10-CM | POA: Diagnosis not present

## 2021-07-15 DEATH — deceased
